# Patient Record
Sex: Male | Born: 1995 | Race: Black or African American | Hispanic: No | Marital: Single | Smoking: Never smoker
Health system: Southern US, Community
[De-identification: ages and names within clinical notes are randomized; demographics above are authoritative.]

---
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
## 2020-03-15 ENCOUNTER — Encounter: Payer: Self-pay | Admitting: Emergency Medicine

## 2020-03-15 ENCOUNTER — Ambulatory Visit
Admission: EM | Admit: 2020-03-15 | Discharge: 2020-03-15 | Disposition: A | Discharge status: Home / Self Care | Payer: 59 | Attending: Emergency Medicine | Admitting: Emergency Medicine

## 2020-03-15 ENCOUNTER — Other Ambulatory Visit: Payer: Self-pay

## 2020-03-15 DIAGNOSIS — T63441A Toxic effect of venom of bees, accidental (unintentional), initial encounter: Secondary | ICD-10-CM | POA: Diagnosis not present

## 2020-03-15 DIAGNOSIS — L03115 Cellulitis of right lower limb: Secondary | ICD-10-CM | POA: Diagnosis not present

## 2020-03-15 MED ORDER — CEPHALEXIN 500 MG PO CAPS: 1000.0000 mg | ORAL_CAPSULE | Freq: Two times a day (BID) | ORAL | 0 refills | Status: AC

## 2020-03-15 MED ORDER — TRIAMCINOLONE ACETONIDE 0.1 % EX CREA
1.0000 | TOPICAL_CREAM | Freq: Two times a day (BID) | CUTANEOUS | 0 refills | Status: DC
Start: 2020-03-15 — End: 2021-01-12

## 2020-03-15 MED ORDER — IBUPROFEN 600 MG PO TABS
600.0000 mg | ORAL_TABLET | Freq: Four times a day (QID) | ORAL | 0 refills | Status: DC | PRN
Start: 2020-03-15 — End: 2021-01-12

## 2020-03-15 NOTE — Discharge Instructions (Addendum)
Finish the Keflex, even if you feel better.  Apply triamcinolone cream for the itching.  Ice to the area, ibuprofen combined with the 1000 mg of Tylenol 3-4 times a day as needed for pain.  Zyrtec or Claritin.  Discontinue Benadryl.  Here is a list of primary care providers who are taking new patients:  Dr. Elizabeth Sauer [address] [place] [address] [phone]  [facility] Care at [facility] [address] [place], [place] [address] [phone]  [facility] [place] [address] [place] [address]  [phone]  [facility] [address] [place], [place] [address] [phone]  [facility] [address] [place]  [phone] [place], [place] [address]  Here are clinics/ other resources who will see you if you do not have insurance. Some have certain criteria that you must meet. Call them and find out what they are:  [facility]: [address], [place], [place] [address] Phone: [phone] Hours: First and Third Saturdays of each Month, 9 a.m. - 1 p.m.  [facility]: [address], [address] [place], [place] [address] Phone: [phone] Hours: Tuesday, 4 p.m. - 8 p.m. Thursday, 1 p.m. - 8 p.m. Wednesday, 9 a.m. - [facility] [address], [place], [place] [address] Phone: [phone] Pharmacy Phone Number: [phone] Dental Phone Number: [phone] [facility] Insurance Help: [phone]  Dental Hours: Monday - Thursday, 8 a.m. - 6 p.m.  [facility] [address] [place], [place] [address] Phone: [phone] Pharmacy Phone Number: [phone] [facility] Insurance Help: [phone]  [facility] [address] [place] [place], [place] [address] Phone: [phone] Pharmacy Phone Number: [phone] [facility] Insurance Help: [phone]  [facility] [address] [place], [place] [address] Phone: [phone] [facility] Insurance Help:  [phone]   [facility] [address] [place], [place] [address] Phone: [phone]  Go to www.goodrx.com to look up your medications. This will give you a list of where you can find your prescriptions at the most affordable prices. Or ask the pharmacist what the cash price is, or if they have any other discount programs available to help make your medication more affordable. This can be less expensive than what you would pay with insurance.

## 2020-03-15 NOTE — ED Provider Notes (Signed)
HPI  SUBJECTIVE:  Larry Glass is a 24 y.o. male who presents with painful erythema of increasing size since being stung by 2 yellow jackets on his posterior lateral right lower extremity 2 days ago.  Describes itching, burning.  States that the pain is getting better and is present when he bends his knee only today.  Reports increased temperature and localized swelling.  No calf swelling.  No lip or tongue swelling, hives, shortness of breath, wheezing or cough, diarrhea, abdominal pain, syncope.  He tried Benadryl 50 mg last night with improvement in his symptoms.  Symptoms are worse with bending his leg.  He has a past medical history of sickle cell trait.  No history of MRSA, diabetes, HIV, immunocompromise.  All immunizations including tetanus are up-to-date.  PMD: None.    History reviewed. No pertinent past medical history.  History reviewed. No pertinent surgical history.  History reviewed. No pertinent family history.  Social History   Tobacco Use  . Smoking status: Never Smoker  . Smokeless tobacco: Never Used  Substance Use Topics  . Alcohol use: Never  . Drug use: Never    No current facility-administered medications for this encounter.  Current Outpatient Medications:  .  cephALEXin (KEFLEX) 500 MG capsule, Take 2 capsules (1,000 mg total) by mouth 2 (two) times daily for 5 days., Disp: 20 capsule, Rfl: 0 .  ibuprofen (ADVIL) 600 MG tablet, Take 1 tablet (600 mg total) by mouth every 6 (six) hours as needed., Disp: 30 tablet, Rfl: 0 .  triamcinolone cream (KENALOG) 0.1 %, Apply 1 application topically 2 (two) times daily. Apply for 2 weeks. May use on face, Disp: 30 g, Rfl: 0  No Known Allergies   ROS  As noted in HPI.   Physical Exam  BP 113/74 (BP Location: Right Arm)   Pulse 68   Temp 98.4 F (36.9 C) (Oral)   Resp 18   Ht 5\' 10"  (1.778 m)   Wt 111.1 kg   SpO2 98%   BMI 35.15 kg/m   Constitutional: Well developed, well nourished, no acute  distress Eyes:  EOMI, conjunctiva normal bilaterally HENT: Normocephalic, atraumatic,mucus membranes moist Respiratory: Normal inspiratory effort Cardiovascular: Normal rate GI: nondistended skin: 15.5 x 13 cm tender area of blanchable erythema, increased temperature, induration posterior right calf.  Positive central blisters.  No fluctuance.  Marked area of erythema with a marker    Musculoskeletal: Right calf slightly larger than the left.  No other calf tenderness right side. Neurologic: Alert & oriented x 3, no focal neuro deficits Psychiatric: Speech and behavior appropriate   ED Course   Medications - No data to display  No orders of the defined types were placed in this encounter.   No results found for this or any previous visit (from the past 24 hour(s)). No results found.  ED Clinical Impression  1. Bee sting, accidental or unintentional, initial encounter   2. Cellulitis of right lower extremity      ED Assessment/Plan   Patient with an infected yellow jacket sting.  Will send home with ice, triamcinolone cream, 5 days of Keflex 1000 mg twice daily, ibuprofen/Tylenol, Zyrtec or Claritin as needed.  Primary care list for ongoing care. Return here 5 days if not better, to the ER if he gets worse.  Discussed MDM, treatment plan, and plan for follow-up with patient. Discussed sn/sx that should prompt return to the ED. patient agrees with plan.   Meds ordered this encounter  Medications  .  cephALEXin (KEFLEX) 500 MG capsule    Sig: Take 2 capsules (1,000 mg total) by mouth 2 (two) times daily for 5 days.    Dispense:  20 capsule    Refill:  0  . ibuprofen (ADVIL) 600 MG tablet    Sig: Take 1 tablet (600 mg total) by mouth every 6 (six) hours as needed.    Dispense:  30 tablet    Refill:  0  . triamcinolone cream (KENALOG) 0.1 %    Sig: Apply 1 application topically 2 (two) times daily. Apply for 2 weeks. May use on face    Dispense:  30 g    Refill:  0     *This clinic note was created using Scientist, clinical (histocompatibility and immunogenetics). Therefore, there may be occasional mistakes despite careful proofreading.   ?    Domenick Gong, MD 03/15/20 1009

## 2020-03-15 NOTE — ED Triage Notes (Signed)
Patient states he was stung by 2 yellow jackets to the back of his right leg on Sunday. He reports redness, swelling and itching.

## 2021-01-12 ENCOUNTER — Other Ambulatory Visit: Payer: Self-pay

## 2021-01-12 ENCOUNTER — Ambulatory Visit
Admission: EM | Admit: 2021-01-12 | Discharge: 2021-01-12 | Disposition: A | Discharge status: Home / Self Care | Payer: 59 | Attending: Emergency Medicine | Admitting: Emergency Medicine

## 2021-01-12 ENCOUNTER — Encounter: Payer: Self-pay | Admitting: Emergency Medicine

## 2021-01-12 DIAGNOSIS — S29019A Strain of muscle and tendon of unspecified wall of thorax, initial encounter: Secondary | ICD-10-CM

## 2021-01-12 MED ORDER — TIZANIDINE HCL 4 MG PO TABS: 4.0000 mg | ORAL_TABLET | Freq: Three times a day (TID) | ORAL | 0 refills | Status: AC | PRN

## 2021-01-12 MED ORDER — IBUPROFEN 600 MG PO TABS: 600.0000 mg | ORAL_TABLET | Freq: Four times a day (QID) | ORAL | 0 refills | Status: AC | PRN

## 2021-01-12 NOTE — ED Triage Notes (Signed)
Patient states he thinks he pulled a muscle in his back yesterday while playing with his niece. He is c/o pain on the right side of his back.

## 2021-01-12 NOTE — Discharge Instructions (Addendum)
Take 600 mg of ibuprofen, 1000 mg Tylenol together 3-4 times a day as needed for pain.  Zanaflex will help with muscle spasm.  Deep tissue massage, heating pad.  Gentle stretching.  Follow-up with primary care provider of your choice, see list below.  Here is a list of primary care providers who are taking new patients:  Dr. Elizabeth Sauer [address] [place] [address] [phone]  [facility] Primary Care at [facility] [address] [place], [place] [address] [phone]  [facility] [place] [address] [place] [address]  [phone]  [facility] [address] [place], [place] [address] [phone]  [facility] [address] [place]  [phone] [place], [place] [address]  Here are clinics/ other resources who will see you if you do not have insurance. Some have certain criteria that you must meet. Call them and find out what they are:  [facility]: [address], [place], [place] [address] Phone: [phone] Hours: First and Third Saturdays of each Month, 9 a.m. - 1 p.m.  [facility]: [address] [place], [place] [address] Phone: [phone] Hours: Tuesday, 4 p.m. - 8 p.m. Thursday, 1 p.m. - 8 p.m. Wednesday, 9 a.m. - [facility] [address] [place], [place] [address] Phone: [phone] Pharmacy Phone Number: [phone] Dental Phone Number: [phone] [facility] Insurance Help: [phone]  Dental Hours: Monday - Thursday, 8 a.m. - 6 p.m.  [facility] [address], [place], [place] [address] Phone: [phone] Pharmacy Phone Number: [phone] [facility] Insurance Help: [phone]  [facility] [address] [place] [place], [place] [address] Phone: [phone] Pharmacy Phone Number: [phone] [facility] Insurance Help: [phone]  [facility] [address] [place], [place] [address] Phone: [phone] [facility]  Insurance Help: [phone]   [facility] [address] [place], [place] [address] Phone: [phone]  Go to www.goodrx.com  or www.costplusdrugs.com to look up your medications. This will give you a list of where you can find your prescriptions at the most affordable prices. Or ask the pharmacist what the cash price is, or if they have any other discount programs available to help make your medication more affordable. This can be less expensive than what you would pay with insurance.

## 2021-01-12 NOTE — ED Provider Notes (Signed)
HPI  SUBJECTIVE:  Larry Glass is a 25 y.o. male who presents with right thoracic back pain located near his scapula starting this morning.  States that he was reaching backward over chair to tickle his niece yesterday, thinks that he may have strained something.  He describes the pain as sharp, spasm-like, and "annoying".  It lasts from seconds to minutes.  No direct trauma, coughing, wheezing, chest pain or shortness of breath, fevers.  He tried 1000 mg of Tylenol and massage gun without improvement in his symptoms.  Symptoms worse with torso rotation to the right, or movement.  He took Tylenol within 6 hours of evaluation.  He has a past medical history of sickle cell trait.  PMD: None    History reviewed. No pertinent past medical history.  History reviewed. No pertinent surgical history.  History reviewed. No pertinent family history.  Social History   Tobacco Use  . Smoking status: Never Smoker  . Smokeless tobacco: Never Used  Substance Use Topics  . Alcohol use: Never  . Drug use: Never    No current facility-administered medications for this encounter.  Current Outpatient Medications:  .  ibuprofen (ADVIL) 600 MG tablet, Take 1 tablet (600 mg total) by mouth every 6 (six) hours as needed., Disp: 30 tablet, Rfl: 0 .  tiZANidine (ZANAFLEX) 4 MG tablet, Take 1 tablet (4 mg total) by mouth every 8 (eight) hours as needed for muscle spasms., Disp: 30 tablet, Rfl: 0  No Known Allergies   ROS  As noted in HPI.   Physical Exam  BP 119/68 (BP Location: Left Arm)   Pulse 64   Temp 98.1 F (36.7 C) (Oral)   Resp 18   Ht 5\' 10"  (1.778 m)   Wt 111.1 kg   SpO2 99%   BMI 35.15 kg/m   Constitutional: Well developed, well nourished, no acute distress Eyes:  EOMI, conjunctiva normal bilaterally HENT: Normocephalic, atraumatic,mucus membranes moist Respiratory: Normal inspiratory effort Cardiovascular: Normal rate GI: nondistended skin: No rash, skin  intact Musculoskeletal: Tenderness along the right latissimus dorsi /serratus anterior at the inferior lateral border of the scapula.  Scapula nontender.  Shoulder nontender.  Patient has full active painless range of motion.  Negative drop test, empty can test, liftoff test.  Grip strength 5/5 and equal bilaterally.  Pain with torso rotation to the right.  RP 2+ and equal. Neurologic: Alert & oriented x 3, no focal neuro deficits Psychiatric: Speech and behavior appropriate   ED Course   Medications - No data to display  No orders of the defined types were placed in this encounter.   No results found for this or any previous visit (from the past 24 hour(s)). No results found.  ED Clinical Impression  1. Thoracic myofascial strain, initial encounter      ED Assessment/Plan  Patient with a musculoskeletal thoracic strain.  Doubt pneumothorax in the absence of chest pain, shortness of breath.  Doubt rib fracture in the absence of trauma, thus imaging was not obtained.  Will send home with Tylenol/ibuprofen, Zanaflex, advise deep tissue massage.  Will provide primary care list for ongoing care in order assistance in finding a PMD.  Discussed MDM, treatment plan, and plan for follow-up with patient. patient agrees with plan.   Meds ordered this encounter  Medications  . ibuprofen (ADVIL) 600 MG tablet    Sig: Take 1 tablet (600 mg total) by mouth every 6 (six) hours as needed.    Dispense:  30 tablet  Refill:  0  . tiZANidine (ZANAFLEX) 4 MG tablet    Sig: Take 1 tablet (4 mg total) by mouth every 8 (eight) hours as needed for muscle spasms.    Dispense:  30 tablet    Refill:  0      *This clinic note was created using Scientist, clinical (histocompatibility and immunogenetics). Therefore, there may be occasional mistakes despite careful proofreading.  ?    Domenick Gong, MD 01/13/21 [phone]
